# Patient Record
Sex: Male | Born: 1990 | Race: Black or African American | Hispanic: No | Smoking: Never smoker
Health system: Southern US, Community
[De-identification: ages and names within clinical notes are randomized; demographics above are authoritative.]

---
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
## 2010-05-22 ENCOUNTER — Emergency Department (HOSPITAL_COMMUNITY): Payer: No Typology Code available for payment source

## 2010-05-22 ENCOUNTER — Emergency Department (HOSPITAL_COMMUNITY)
Admission: EM | Admit: 2010-05-22 | Discharge: 2010-05-22 | Disposition: A | Discharge status: Home / Self Care | Payer: No Typology Code available for payment source | Attending: Emergency Medicine | Admitting: Emergency Medicine

## 2010-05-22 DIAGNOSIS — M542 Cervicalgia: Secondary | ICD-10-CM | POA: Insufficient documentation

## 2010-05-22 DIAGNOSIS — T1490XA Injury, unspecified, initial encounter: Secondary | ICD-10-CM | POA: Insufficient documentation

## 2010-05-22 DIAGNOSIS — M549 Dorsalgia, unspecified: Secondary | ICD-10-CM | POA: Insufficient documentation

## 2010-05-22 DIAGNOSIS — Y92410 Unspecified street and highway as the place of occurrence of the external cause: Secondary | ICD-10-CM | POA: Insufficient documentation

## 2017-02-07 ENCOUNTER — Emergency Department (HOSPITAL_COMMUNITY)
Admission: EM | Admit: 2017-02-07 | Discharge: 2017-02-08 | Disposition: A | Discharge status: Home / Self Care | Payer: BLUE CROSS/BLUE SHIELD | Attending: Emergency Medicine | Admitting: Emergency Medicine

## 2017-02-07 ENCOUNTER — Encounter (HOSPITAL_COMMUNITY): Payer: Self-pay | Admitting: Radiology

## 2017-02-07 ENCOUNTER — Emergency Department (HOSPITAL_COMMUNITY): Payer: BLUE CROSS/BLUE SHIELD

## 2017-02-07 DIAGNOSIS — R197 Diarrhea, unspecified: Secondary | ICD-10-CM | POA: Diagnosis not present

## 2017-02-07 DIAGNOSIS — R531 Weakness: Secondary | ICD-10-CM | POA: Diagnosis not present

## 2017-02-07 DIAGNOSIS — R42 Dizziness and giddiness: Secondary | ICD-10-CM | POA: Insufficient documentation

## 2017-02-07 DIAGNOSIS — R112 Nausea with vomiting, unspecified: Secondary | ICD-10-CM | POA: Diagnosis present

## 2017-02-07 DIAGNOSIS — R1033 Periumbilical pain: Secondary | ICD-10-CM | POA: Insufficient documentation

## 2017-02-07 LAB — URINALYSIS, ROUTINE W REFLEX MICROSCOPIC
Bilirubin Urine: NEGATIVE
GLUCOSE, UA: NEGATIVE mg/dL
HGB URINE DIPSTICK: NEGATIVE
Ketones, ur: 20 mg/dL — AB
Leukocytes, UA: NEGATIVE
Nitrite: NEGATIVE
Protein, ur: NEGATIVE mg/dL
SPECIFIC GRAVITY, URINE: 1.02 (ref 1.005–1.030)
pH: 9 — ABNORMAL HIGH (ref 5.0–8.0)

## 2017-02-07 LAB — CBC WITH DIFFERENTIAL/PLATELET
Basophils Absolute: 0 10*3/uL (ref 0.0–0.1)
Basophils Relative: 0 %
Eosinophils Absolute: 0 10*3/uL (ref 0.0–0.7)
Eosinophils Relative: 0 %
HCT: 41.3 % (ref 39.0–52.0)
HEMOGLOBIN: 14 g/dL (ref 13.0–17.0)
LYMPHS ABS: 0.6 10*3/uL — AB (ref 0.7–4.0)
Lymphocytes Relative: 7 %
MCH: 27.5 pg (ref 26.0–34.0)
MCHC: 33.9 g/dL (ref 30.0–36.0)
MCV: 81.1 fL (ref 78.0–100.0)
MONO ABS: 0.4 10*3/uL (ref 0.1–1.0)
MONOS PCT: 4 %
NEUTROS PCT: 89 %
Neutro Abs: 8.1 10*3/uL — ABNORMAL HIGH (ref 1.7–7.7)
Platelets: 217 10*3/uL (ref 150–400)
RBC: 5.09 MIL/uL (ref 4.22–5.81)
RDW: 12.3 % (ref 11.5–15.5)
WBC: 9.1 10*3/uL (ref 4.0–10.5)

## 2017-02-07 LAB — LIPASE, BLOOD: LIPASE: 29 U/L (ref 11–51)

## 2017-02-07 LAB — COMPREHENSIVE METABOLIC PANEL
ALBUMIN: 4.5 g/dL (ref 3.5–5.0)
ALT: 16 U/L — AB (ref 17–63)
AST: 22 U/L (ref 15–41)
Alkaline Phosphatase: 43 U/L (ref 38–126)
Anion gap: 10 (ref 5–15)
BUN: 19 mg/dL (ref 6–20)
CHLORIDE: 108 mmol/L (ref 101–111)
CO2: 22 mmol/L (ref 22–32)
Calcium: 9.3 mg/dL (ref 8.9–10.3)
Creatinine, Ser: 1.2 mg/dL (ref 0.61–1.24)
GFR calc non Af Amer: >60 mL/min (ref >60)
GLUCOSE: 96 mg/dL (ref 65–99)
Potassium: 3.5 mmol/L (ref 3.5–5.1)
SODIUM: 140 mmol/L (ref 135–145)
Total Bilirubin: 0.8 mg/dL (ref 0.3–1.2)
Total Protein: 7.7 g/dL (ref 6.5–8.1)

## 2017-02-07 MED ORDER — SODIUM CHLORIDE 0.9 % IV BOLUS (SEPSIS)
1000.0000 mL | Freq: Once | INTRAVENOUS | Status: AC
  Administered 2017-02-07: 1000 mL via INTRAVENOUS

## 2017-02-07 MED ORDER — DICYCLOMINE HCL 20 MG PO TABS: 20.0000 mg | ORAL_TABLET | Freq: Two times a day (BID) | ORAL | 0 refills | Status: DC

## 2017-02-07 MED ORDER — DICYCLOMINE HCL 10 MG PO CAPS
10.0000 mg | ORAL_CAPSULE | Freq: Once | ORAL | Status: AC
  Administered 2017-02-07: 10 mg via ORAL
  Filled 2017-02-07: qty 1

## 2017-02-07 MED ORDER — ONDANSETRON HCL 4 MG PO TABS: 4.0000 mg | ORAL_TABLET | Freq: Four times a day (QID) | ORAL | 0 refills | Status: DC

## 2017-02-07 MED ORDER — IOPAMIDOL (ISOVUE-300) INJECTION 61%
INTRAVENOUS | Status: AC
  Administered 2017-02-07: 100 mL via INTRAVENOUS
  Filled 2017-02-07: qty 100

## 2017-02-07 MED ORDER — PROMETHAZINE HCL 25 MG/ML IJ SOLN
25.0000 mg | Freq: Once | INTRAMUSCULAR | Status: AC
  Administered 2017-02-07: 25 mg via INTRAVENOUS
  Filled 2017-02-07: qty 1

## 2017-02-07 MED ORDER — ONDANSETRON 4 MG PO TBDP
4.0000 mg | ORAL_TABLET | Freq: Once | ORAL | Status: AC
  Administered 2017-02-07: 4 mg via ORAL
  Filled 2017-02-07: qty 1

## 2017-02-07 MED ORDER — LOPERAMIDE HCL 2 MG PO CAPS: 2.0000 mg | ORAL_CAPSULE | Freq: Four times a day (QID) | ORAL | 0 refills | Status: DC | PRN

## 2017-02-07 NOTE — ED Notes (Signed)
Bed: WA08 Expected date:  Expected time:  Means of arrival:  Comments: Possible EMS

## 2017-02-07 NOTE — ED Notes (Signed)
Pt states he cant hold the emesis bag and has proceeded to vomit all over the room.

## 2017-02-07 NOTE — ED Notes (Signed)
Have informed pt that urine sample is needed and given urinal. Pt continues to sleep

## 2017-02-07 NOTE — ED Provider Notes (Signed)
[facility]-EMERGENCY DEPT Provider Note   CSN: [id number] Arrival date & time: 02/07/17  1836     History   Chief Complaint Chief Complaint  Patient presents with  . N/V    HPI Brian Patel is a 27 y.o. male who presents for evaluation of nausea/vomiting that began approximately 3 PM this afternoon.  Patient reports multiple episodes of nonbloody, nonbilious vomiting since onset of symptoms.  Patient also reports multiple episodes of loose stool.  Denies any melena, bright red blood per rectum.  Patient reports that he feels nauseous and does not know if that is covering any lower abdominal pain.  He states that he had been in his normal state of health the day prior to onset of symptoms.  He states that he did not eat any new foods.  Patient called EMS for symptoms.  During EMS arrival, he got fluids, Zofran.  He reports no improvement with nausea with the Zofran.  Patient also reports intermittent tingling sensation to bilateral lower extremities.  He has been able to ambulate without any difficulty.  Patient denies any fevers, chest pain, difficulty breathing, dysuria, hematuria, back pain, testicular pain, penile pain, penile discharge. Denies fevers, weight loss, numbness/weakness of upper and lower extremities, bowel/bladder incontinence, saddle anesthesia, history of back surgery, history of IVDA.   The history is provided by the patient.    History reviewed. No pertinent past medical history.  There are no active problems to display for this patient.   The histories are not reviewed yet. Please review them in the "History" navigator section and refresh this SmartLink.     Home Medications    Prior to Admission medications   Medication Sig Start Date End Date Taking? Authorizing Provider  acetaminophen (TYLENOL) 325 MG tablet Take 650 mg by mouth every 6 (six) hours as needed.   Yes [provider]  Black Elderberry,Berry-Flower, 575 MG  CAPS Take 1 capsule by mouth.   Yes [provider]  ibuprofen (ADVIL,MOTRIN) 600 MG tablet Take 600 mg by mouth every 6 (six) hours as needed for headache.   Yes [provider]  Multiple Vitamins-Minerals (AIRBORNE GUMMIES PO) Take 1 capsule by mouth.   Yes [provider]  Sodium Bicarbonate-Citric Acid (ALKA-SELTZER HEARTBURN) 1940-1000 MG TBEF Take 1 Dose by mouth daily as needed (Heartburn).   Yes [provider]  dicyclomine (BENTYL) 20 MG tablet Take 1 tablet (20 mg total) by mouth 2 (two) times daily for 7 days. 02/07/17 02/14/17  Maxwell Caul, PA-C  loperamide (IMODIUM) 2 MG capsule Take 1 capsule (2 mg total) by mouth 4 (four) times daily as needed for diarrhea or loose stools. 02/07/17   Maxwell Caul, PA-C  ondansetron (ZOFRAN) 4 MG tablet Take 1 tablet (4 mg total) by mouth every 6 (six) hours. 02/07/17   Maxwell Caul, PA-C    Family History No family history on file.  Social History Social History   Tobacco Use  . Smoking status: Not on file  Substance Use Topics  . Alcohol use: Not on file  . Drug use: Not on file     Allergies   Patient has no allergy information on record.   Review of Systems Review of Systems  Constitutional: Negative for fever.  Respiratory: Negative for cough and shortness of breath.   Cardiovascular: Negative for chest pain.  Gastrointestinal: Positive for diarrhea, nausea and vomiting. Negative for abdominal pain.  Genitourinary: Negative for dysuria and hematuria.  Neurological:  Negative for headaches.     Physical Exam Updated Vital Signs BP (!) 108/53 (BP Location: Right Arm)   Pulse 100   Temp 98.7 F (37.1 C) (Oral)   Resp 16   SpO2 100%   Physical Exam  Constitutional: He is oriented to person, place, and time. He appears well-developed and well-nourished.  Appears uncomfortable but no acute distress   HENT:  Head: Normocephalic and atraumatic.  Mouth/Throat: Oropharynx is  clear and moist. Mucous membranes are dry.  Eyes: Conjunctivae, EOM and lids are normal. Pupils are equal, round, and reactive to light.  Neck: Full passive range of motion without pain.  Cardiovascular: Normal rate, regular rhythm, normal heart sounds and normal pulses. Exam reveals no gallop and no friction rub.  No murmur heard. Pulses:      Dorsalis pedis pulses are 2+ on the right side, and 2+ on the left side.  Pulmonary/Chest: Effort normal and breath sounds normal.  Abdominal: Soft. Normal appearance. There is tenderness in the suprapubic area. There is no rigidity, no guarding and no CVA tenderness. Hernia confirmed negative in the right inguinal area and confirmed negative in the left inguinal area.  Abdomen soft, nondistended.  Patient does exhibit some suprapubic tenderness.  No CVA tenderness bilaterally.  Genitourinary: Testes normal and penis normal. Right testis shows no swelling and no tenderness. Left testis shows no swelling and no tenderness. Uncircumcised.  Genitourinary Comments: The exam was performed with a chaperone present. Normal male genitalia. No evidence of rash, ulcers or lesions.   Musculoskeletal: Normal range of motion.  Neurological: He is alert and oriented to person, place, and time.  Follows commands, Moves all extremities  5/5 strength to BUE and BLE  Sensation intact throughout all major nerve distributions  Skin: Skin is warm and dry. Capillary refill takes less than 2 seconds.  Psychiatric: He has a normal mood and affect. His speech is normal.  Nursing note and vitals reviewed.    ED Treatments / Results  Labs (all labs ordered are listed, but only abnormal results are displayed) Labs Reviewed  COMPREHENSIVE METABOLIC PANEL - Abnormal; Notable for the following components:      Result Value   ALT 16 (*)    All other components within normal limits  CBC WITH DIFFERENTIAL/PLATELET - Abnormal; Notable for the following components:   Neutro Abs  8.1 (*)    Lymphs Abs 0.6 (*)    All other components within normal limits  URINALYSIS, ROUTINE W REFLEX MICROSCOPIC - Abnormal; Notable for the following components:   pH 9.0 (*)    Ketones, ur 20 (*)    All other components within normal limits  LIPASE, BLOOD    EKG  EKG Interpretation None       Radiology Ct Abdomen Pelvis W Contrast  Result Date: 02/07/2017 CLINICAL DATA:  Nausea and vomiting since this afternoon with generalized weakness and dizziness. Generalized body aches. EXAM: CT ABDOMEN AND PELVIS WITH CONTRAST TECHNIQUE: Multidetector CT imaging of the abdomen and pelvis was performed using the standard protocol following bolus administration of intravenous contrast. CONTRAST:  100mL ISOVUE-300 IOPAMIDOL (ISOVUE-300) INJECTION 61% COMPARISON:  None. FINDINGS: Lower chest: No acute abnormality. Hepatobiliary: Small hypodensity over the inferior aspect of the right lobe of the liver likely a hemangioma. Gallbladder and biliary tree are within normal. Pancreas: Normal. Spleen: Normal. Adrenals/Urinary Tract: Adrenal glands are normal. Kidneys are normal in size without hydronephrosis or nephrolithiasis. Ureters and bladder are normal. Stomach/Bowel: Stomach and small bowel are  within normal. Appendix is normal. Colon is somewhat decompressed but otherwise within normal. Vascular/Lymphatic: Within normal. Reproductive: Normal. Other: No free fluid or focal inflammatory change. Musculoskeletal: Normal. IMPRESSION: No acute findings in the abdomen/pelvis. Electronically Signed   By: Elberta Fortis M.D.   On: 02/07/2017 22:12    Procedures Procedures (including critical care time)  Medications Ordered in ED Medications  promethazine (PHENERGAN) injection 25 mg (25 mg Intravenous Given 02/07/17 2023)  sodium chloride 0.9 % bolus 1,000 mL (0 mLs Intravenous Stopped 02/07/17 2106)  iopamidol (ISOVUE-300) 61 % injection (100 mLs Intravenous Contrast Given 02/07/17 2148)  dicyclomine  (BENTYL) capsule 10 mg (10 mg Oral Given 02/07/17 2328)  ondansetron (ZOFRAN-ODT) disintegrating tablet 4 mg (4 mg Oral Given 02/07/17 2328)     Initial Impression / Assessment and Plan / ED Course  I have reviewed the triage vital signs and the nursing notes.  Pertinent labs & imaging results that were available during my care of the patient were reviewed by me and considered in my medical decision making (see chart for details).     27 y.o. male who presents for evaluation of nausea/vomiting that began 3 PM this afternoon.  He also is having loose stools.  No fevers.  No dysuria, hematuria, testicular pain or swelling. Patient is afebrile, non-toxic appearing. Vital signs reviewed and stable.  On exam, patient is actively vomiting.  Diffuse suprapubic tenderness.  No true McBurney's point tenderness but he does have some difficulty being examined secondary to vomiting.  GU exam is without any abnormalities.  Consider acute infectious etiology vs viral GI process.  History/physical exam are not concerning for kidney stone, diverticulitis, appendicitis.  Plan for additional antiemetics, IVF.  Will plan for basic labs.  Labs reviewed.  CBC is without any acute leukocytosis.  CMP shows no acute abnormalities.  UA is without any acute signs of infection.  Lipase is unremarkable. Given difficulty in assessing abdominal tenderness, will plan for CT abd/pelvis for further evaluation.   CT abd/pelvis reviewed. Negative for any acute abnormalities. Discussed results with patient.  He reports improvement in symptoms since being here in the ED. He has not had any additional episodes of vomiting since being in the ED. Repeat abdominal exam shows improvement in tenderness. Plan to p.o. challenge patient in the department.  Patient able to tolerate PO in the department without any difficulty. He is ambulating in the department without difficulty. Vitals stable. Suspect symptoms are secondary to viral GI process.  Patient reports his GF is being evaluted for same symptoms. Patient stable for discharge at this time. Plan to send home with anti-emetics. Patient had ample opportunity for questions and discussion. All patient's questions were answered with full understanding. Strict return precautions discussed. Patient expresses understanding and agreement to plan.    Final Clinical Impressions(s) / ED Diagnoses   Final diagnoses:  Non-intractable vomiting with nausea, unspecified vomiting type  Diarrhea, unspecified type    ED Discharge Orders        Ordered    ondansetron (ZOFRAN) 4 MG tablet  Every 6 hours     02/07/17 2333    dicyclomine (BENTYL) 20 MG tablet  2 times daily     02/07/17 2333    loperamide (IMODIUM) 2 MG capsule  4 times daily PRN     02/07/17 2333       Maxwell Caul, PA-C 02/08/17 1113    Lorre Nick, MD 02/09/17 [phone]

## 2017-02-07 NOTE — ED Triage Notes (Signed)
Per EMS-states nausea and vomiting since 3pm-states generalized weakness and feeling dizziness-4 mg of Zofran given in route-general body aches-positive response from Zofran-HR 102, BP 137/87, CBG157-500 cc bolus given in route

## 2017-02-07 NOTE — Discharge Instructions (Signed)
Take zofran as directed for nausea.   You can take the bentyl as needed for abdominal cramping.   You can use the Immodium as directed.   Follow-up with your primary care doctor the next 24-48 hours.  If you do not have primary care doctor, you can use the [facility] clinic listed below.  Return to the emergency department for any worsening pain, fever, persistent vomiting, pain of your testicles or penis, inability to ambulate or any other worsening or concerning symptoms.

## 2017-02-07 NOTE — ED Notes (Signed)
Pt ambulated in hallway without assistance. Pt was steady on his feet.

## 2018-08-17 IMAGING — CT CT ABD-PELV W/ CM
2 of 4 series · 16 of 46 positions shown, 18 images · IV contrast (iopamidol)
Comparison: None.

CLINICAL DATA: Nausea and vomiting since this afternoon with
generalized weakness and dizziness. Generalized body aches.

EXAM:
CT ABDOMEN AND PELVIS WITH CONTRAST
TECHNIQUE: Multidetector CT imaging of the abdomen and pelvis was performed
using the standard protocol following bolus administration of
intravenous contrast.
CONTRAST:  100mL [id number]-IUU IOPAMIDOL (6E5XOY-IUU) INJECTION 61%

[Series 2: axial st · axial · 0.71mm/px · z∈[-625,-220]mm · 13 of 91 slices shown, 15 images]
[im 5/91  soft-tissue]
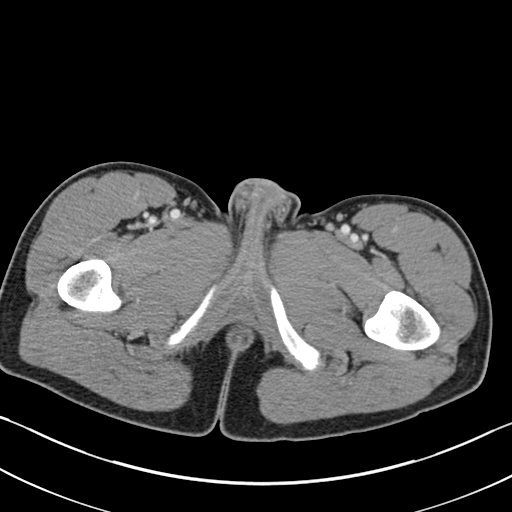
[im 5/91  bone]
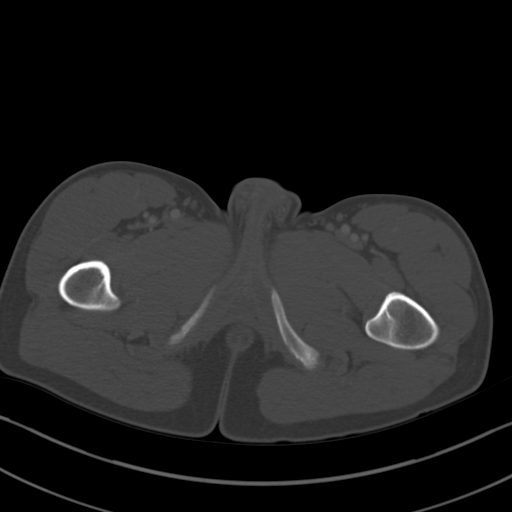
[im 13/91  soft-tissue]
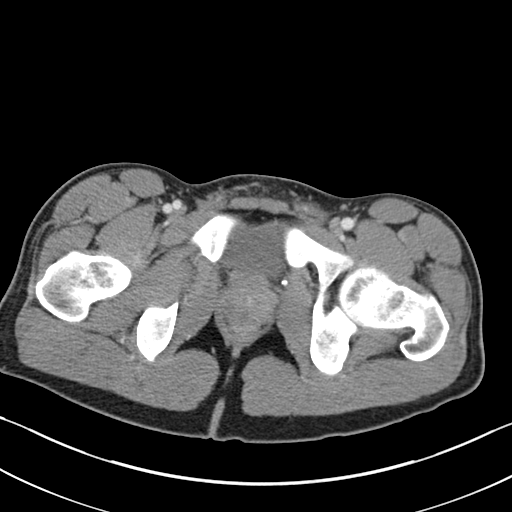
[im 21/91  soft-tissue]
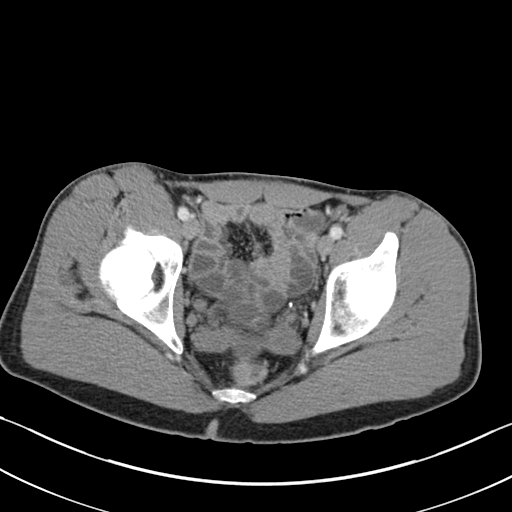
[im 25/91  soft-tissue]
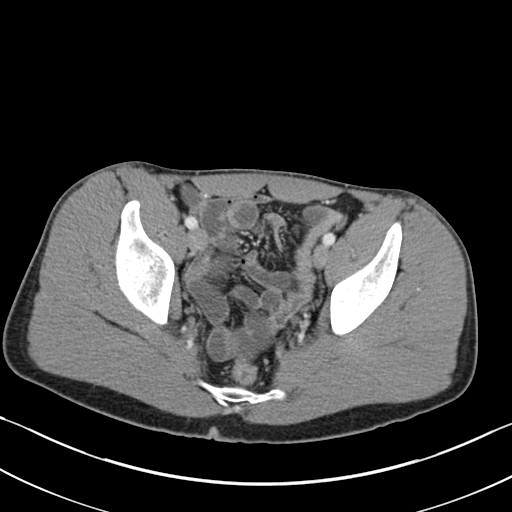
[im 33/91  soft-tissue]
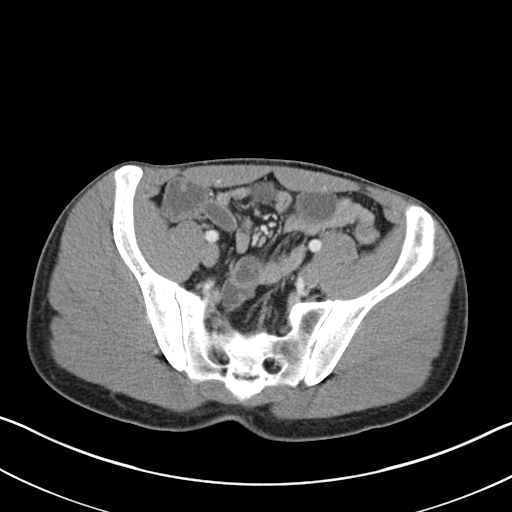
[im 37/91  soft-tissue]
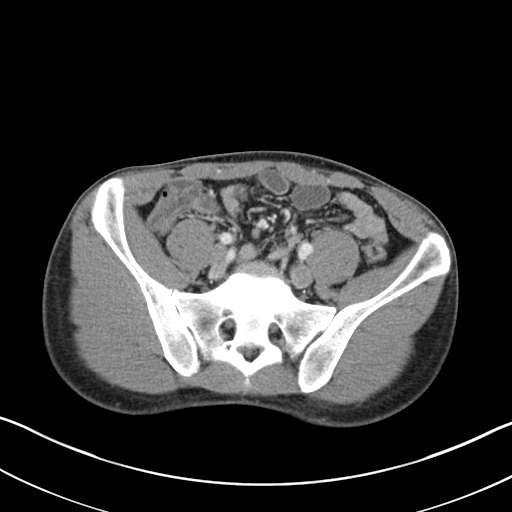
[im 46/91  soft-tissue]
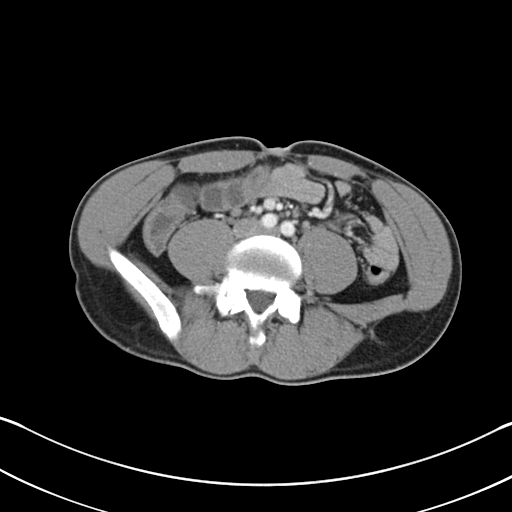
[im 54/91  soft-tissue]
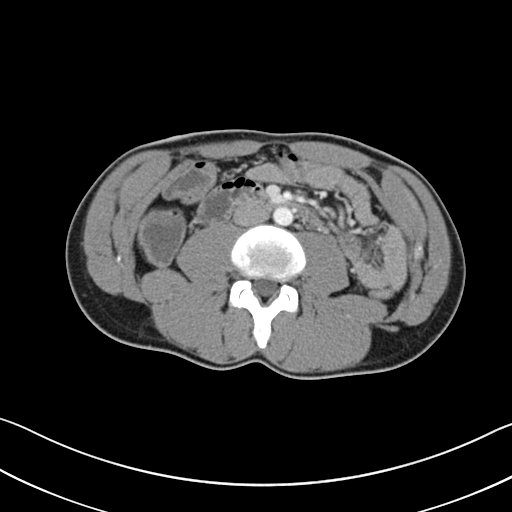
[im 58/91  soft-tissue]
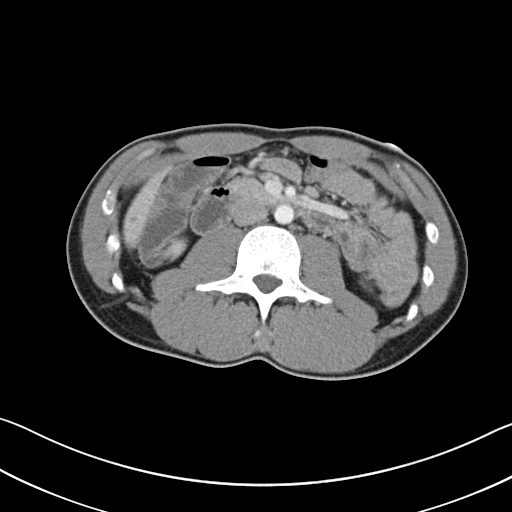
[im 58/91  bone]
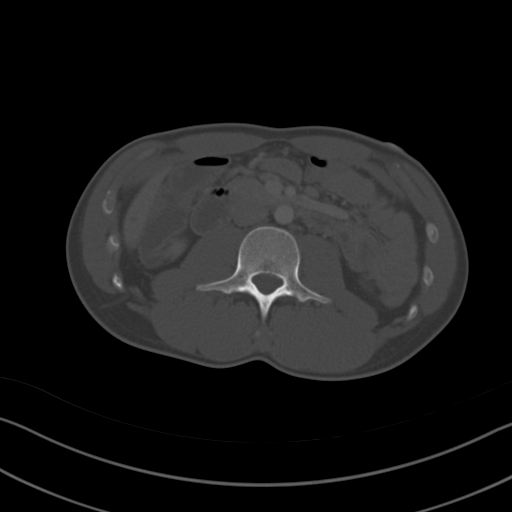
[im 66/91  soft-tissue]
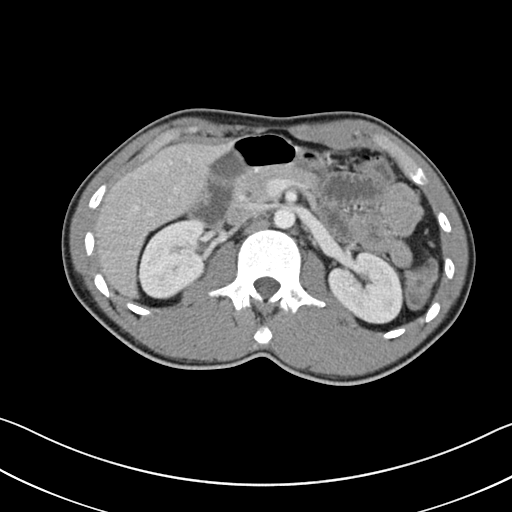
[im 70/91  soft-tissue]
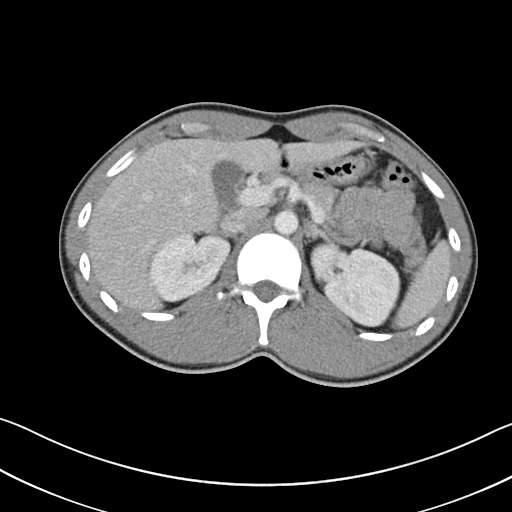
[im 78/91  soft-tissue]
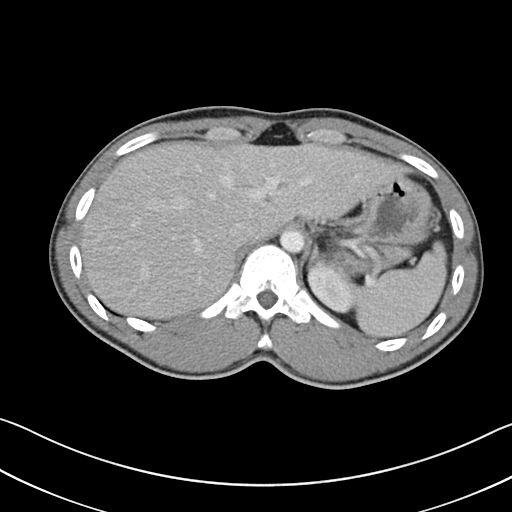
[im 86/91  soft-tissue]
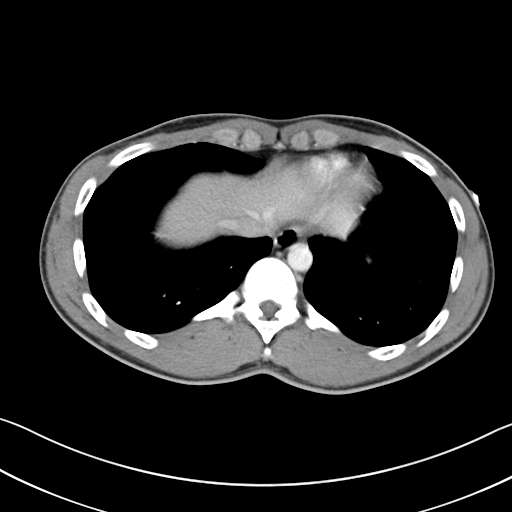

[Series 5: coronal st · coronal · 0.67mm/px · 3 of 61 slices shown]
[im 21/61  soft-tissue]
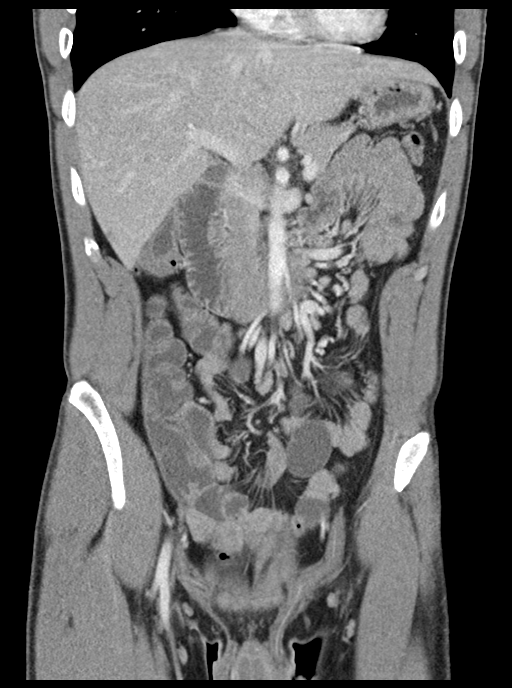
[im 27/61  soft-tissue]
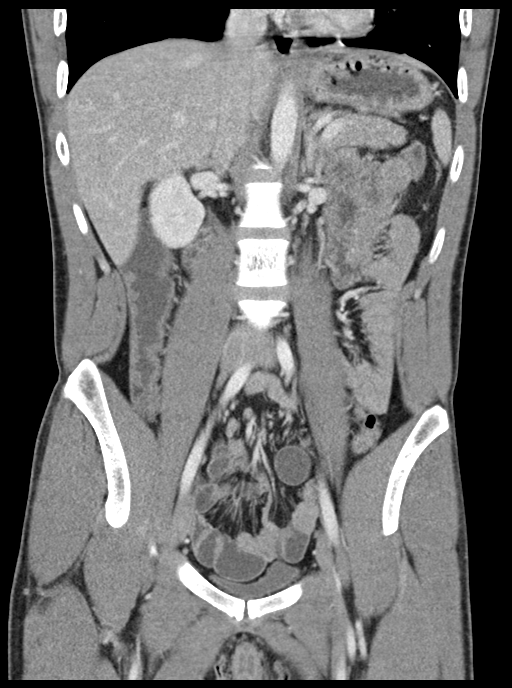
[im 34/61  soft-tissue]
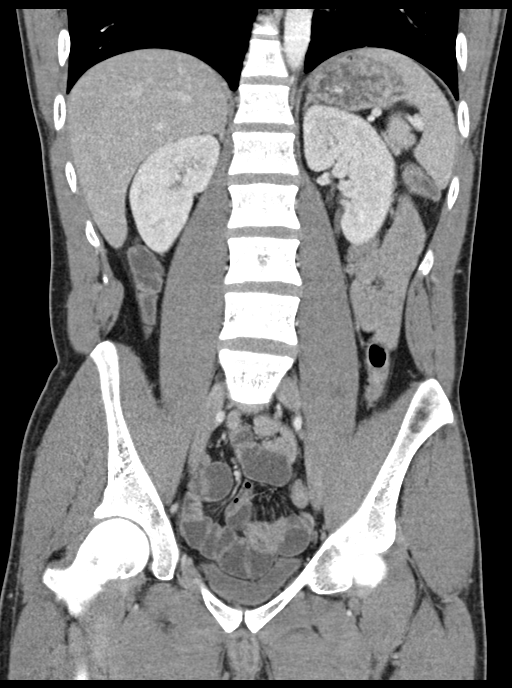

[16 of 46 positions shown; findings below may reference images not displayed]

FINDINGS: Lower chest: No acute abnormality.

Hepatobiliary: Small hypodensity over the inferior aspect of the
right lobe of the liver likely a hemangioma. Gallbladder and biliary
tree are within normal.

Pancreas: Normal.

Spleen: Normal.

Adrenals/Urinary Tract: Adrenal glands are normal. Kidneys are
normal in size without hydronephrosis or nephrolithiasis. Ureters
and bladder are normal.

Stomach/Bowel: Stomach and small bowel are within normal. Appendix
is normal. Colon is somewhat decompressed but otherwise within
normal.

Vascular/Lymphatic: Within normal.

Reproductive: Normal.

Other: No free fluid or focal inflammatory change.

Musculoskeletal: Normal.
IMPRESSION: No acute findings in the abdomen/pelvis.

## 2021-03-09 DIAGNOSIS — K219 Gastro-esophageal reflux disease without esophagitis: Secondary | ICD-10-CM | POA: Diagnosis not present

## 2021-03-09 DIAGNOSIS — R5383 Other fatigue: Secondary | ICD-10-CM | POA: Diagnosis not present

## 2021-03-09 DIAGNOSIS — J302 Other seasonal allergic rhinitis: Secondary | ICD-10-CM | POA: Diagnosis not present

## 2021-03-09 DIAGNOSIS — E559 Vitamin D deficiency, unspecified: Secondary | ICD-10-CM | POA: Diagnosis not present

## 2021-03-09 DIAGNOSIS — Z0000 Encounter for general adult medical examination without abnormal findings: Secondary | ICD-10-CM | POA: Diagnosis not present

## 2021-03-09 DIAGNOSIS — Z114 Encounter for screening for human immunodeficiency virus [HIV]: Secondary | ICD-10-CM | POA: Diagnosis not present

## 2021-03-09 DIAGNOSIS — Z1339 Encounter for screening examination for other mental health and behavioral disorders: Secondary | ICD-10-CM | POA: Diagnosis not present

## 2021-03-09 DIAGNOSIS — Z7251 High risk heterosexual behavior: Secondary | ICD-10-CM | POA: Diagnosis not present

## 2021-03-09 DIAGNOSIS — Z1331 Encounter for screening for depression: Secondary | ICD-10-CM | POA: Diagnosis not present

## 2021-03-23 DIAGNOSIS — R030 Elevated blood-pressure reading, without diagnosis of hypertension: Secondary | ICD-10-CM | POA: Diagnosis not present

## 2021-03-23 DIAGNOSIS — E7800 Pure hypercholesterolemia, unspecified: Secondary | ICD-10-CM | POA: Diagnosis not present

## 2021-03-23 DIAGNOSIS — J302 Other seasonal allergic rhinitis: Secondary | ICD-10-CM | POA: Diagnosis not present

## 2021-03-23 DIAGNOSIS — K219 Gastro-esophageal reflux disease without esophagitis: Secondary | ICD-10-CM | POA: Diagnosis not present

## 2021-11-17 DIAGNOSIS — F4324 Adjustment disorder with disturbance of conduct: Secondary | ICD-10-CM | POA: Diagnosis not present

## 2022-01-31 DIAGNOSIS — R300 Dysuria: Secondary | ICD-10-CM | POA: Diagnosis not present

## 2022-01-31 DIAGNOSIS — K219 Gastro-esophageal reflux disease without esophagitis: Secondary | ICD-10-CM | POA: Diagnosis not present

## 2022-01-31 DIAGNOSIS — E559 Vitamin D deficiency, unspecified: Secondary | ICD-10-CM | POA: Diagnosis not present

## 2022-01-31 DIAGNOSIS — Z6825 Body mass index (BMI) 25.0-25.9, adult: Secondary | ICD-10-CM | POA: Diagnosis not present

## 2022-01-31 DIAGNOSIS — Z1159 Encounter for screening for other viral diseases: Secondary | ICD-10-CM | POA: Diagnosis not present

## 2022-01-31 DIAGNOSIS — Z0000 Encounter for general adult medical examination without abnormal findings: Secondary | ICD-10-CM | POA: Diagnosis not present

## 2022-01-31 DIAGNOSIS — J302 Other seasonal allergic rhinitis: Secondary | ICD-10-CM | POA: Diagnosis not present

## 2022-01-31 DIAGNOSIS — R5383 Other fatigue: Secondary | ICD-10-CM | POA: Diagnosis not present

## 2022-03-07 DIAGNOSIS — B9689 Other specified bacterial agents as the cause of diseases classified elsewhere: Secondary | ICD-10-CM | POA: Diagnosis not present

## 2022-03-07 DIAGNOSIS — J329 Chronic sinusitis, unspecified: Secondary | ICD-10-CM | POA: Diagnosis not present

## 2022-03-07 DIAGNOSIS — Z20822 Contact with and (suspected) exposure to covid-19: Secondary | ICD-10-CM | POA: Diagnosis not present

## 2022-03-07 DIAGNOSIS — R059 Cough, unspecified: Secondary | ICD-10-CM | POA: Diagnosis not present

## 2022-06-21 DIAGNOSIS — F4324 Adjustment disorder with disturbance of conduct: Secondary | ICD-10-CM | POA: Diagnosis not present

## 2022-06-25 DIAGNOSIS — F4324 Adjustment disorder with disturbance of conduct: Secondary | ICD-10-CM | POA: Diagnosis not present

## 2022-07-02 DIAGNOSIS — F4324 Adjustment disorder with disturbance of conduct: Secondary | ICD-10-CM | POA: Diagnosis not present

## 2022-07-16 DIAGNOSIS — F4324 Adjustment disorder with disturbance of conduct: Secondary | ICD-10-CM | POA: Diagnosis not present

## 2022-07-19 DIAGNOSIS — F4324 Adjustment disorder with disturbance of conduct: Secondary | ICD-10-CM | POA: Diagnosis not present

## 2022-07-24 DIAGNOSIS — F4324 Adjustment disorder with disturbance of conduct: Secondary | ICD-10-CM | POA: Diagnosis not present

## 2022-07-26 ENCOUNTER — Other Ambulatory Visit: Payer: Self-pay

## 2022-07-26 ENCOUNTER — Encounter (HOSPITAL_BASED_OUTPATIENT_CLINIC_OR_DEPARTMENT_OTHER): Payer: Self-pay | Admitting: Emergency Medicine

## 2022-07-26 ENCOUNTER — Emergency Department (HOSPITAL_BASED_OUTPATIENT_CLINIC_OR_DEPARTMENT_OTHER): Payer: Self-pay

## 2022-07-26 ENCOUNTER — Emergency Department (HOSPITAL_BASED_OUTPATIENT_CLINIC_OR_DEPARTMENT_OTHER)
Admission: EM | Admit: 2022-07-26 | Discharge: 2022-07-26 | Disposition: A | Discharge status: Home / Self Care | Payer: Self-pay | Attending: Emergency Medicine | Admitting: Emergency Medicine

## 2022-07-26 DIAGNOSIS — F411 Generalized anxiety disorder: Secondary | ICD-10-CM | POA: Diagnosis not present

## 2022-07-26 DIAGNOSIS — R079 Chest pain, unspecified: Secondary | ICD-10-CM | POA: Diagnosis not present

## 2022-07-26 DIAGNOSIS — R002 Palpitations: Secondary | ICD-10-CM | POA: Diagnosis not present

## 2022-07-26 DIAGNOSIS — R9431 Abnormal electrocardiogram [ECG] [EKG]: Secondary | ICD-10-CM | POA: Insufficient documentation

## 2022-07-26 DIAGNOSIS — R0789 Other chest pain: Secondary | ICD-10-CM | POA: Diagnosis not present

## 2022-07-26 DIAGNOSIS — F331 Major depressive disorder, recurrent, moderate: Secondary | ICD-10-CM | POA: Diagnosis not present

## 2022-07-26 LAB — BASIC METABOLIC PANEL
Anion gap: 8 (ref 5–15)
BUN: 17 mg/dL (ref 6–20)
CO2: 28 mmol/L (ref 22–32)
Calcium: 9.7 mg/dL (ref 8.9–10.3)
Chloride: 102 mmol/L (ref 98–111)
Creatinine, Ser: 1.11 mg/dL (ref 0.61–1.24)
GFR, Estimated: >60 mL/min (ref >60)
Glucose, Bld: 94 mg/dL (ref 70–99)
Potassium: 3.7 mmol/L (ref 3.5–5.1)
Sodium: 138 mmol/L (ref 135–145)

## 2022-07-26 LAB — CBC
HCT: 40.6 % (ref 39.0–52.0)
Hemoglobin: 12.9 g/dL — ABNORMAL LOW (ref 13.0–17.0)
MCH: 26.7 pg (ref 26.0–34.0)
MCHC: 31.8 g/dL (ref 30.0–36.0)
MCV: 84.1 fL (ref 80.0–100.0)
Platelets: 234 10*3/uL (ref 150–400)
RBC: 4.83 MIL/uL (ref 4.22–5.81)
RDW: 12.7 % (ref 11.5–15.5)
WBC: 5.8 10*3/uL (ref 4.0–10.5)
nRBC: 0 % (ref 0.0–0.2)

## 2022-07-26 LAB — TROPONIN I (HIGH SENSITIVITY): Troponin I (High Sensitivity): 2 ng/L (ref <18)

## 2022-07-26 LAB — MAGNESIUM: Magnesium: 2.1 mg/dL (ref 1.7–2.4)

## 2022-07-26 NOTE — ED Notes (Signed)
 RN reviewed discharge instructions with pt. Pt verbalized understanding and had no further questions. VSS upon discharge.  

## 2022-07-26 NOTE — ED Triage Notes (Signed)
Pt was being seen by new primary, he hasn't had any time to have himself checked out. He states the MD said he had an irregular EKG. Pt has no medical history. Does endorse some anxiety . He states that he has been having intermittent midsternal chest pain ,sharp and sometimes his left arm hurts as well, this has been going on for years and didn't have time to take care of  himself.

## 2022-07-26 NOTE — ED Provider Notes (Signed)
[place] EMERGENCY DEPARTMENT AT [facility] Provider Note   CSN: [id number] Arrival date & time: 07/26/22  1737     History Chief Complaint  Patient presents with   Abnormal ECG    HPI Brian Patel is a 32 y.o. male presenting for chief complaint of palpitations.  32 year old male minimal medical history.  History of anxiety seen by new PCP today found to have sinus arrhythmia on his EKG sent from PCP to emergency department for further diagnostic care and management.  Denies fevers chills nausea vomiting syncope shortness of breath or chest pain.  Otherwise ambulatory tolerating p.o. intake.  Endorse a longstanding history of anxiety that he follows with his PCP regarding..   Patient's recorded medical, surgical, social, medication list and allergies were reviewed in the Snapshot window as part of the initial history.   Review of Systems   Review of Systems  Constitutional:  Negative for chills and fever.  HENT:  Negative for ear pain and sore throat.   Eyes:  Negative for pain and visual disturbance.  Respiratory:  Negative for cough and shortness of breath.   Cardiovascular:  Positive for palpitations. Negative for chest pain.  Gastrointestinal:  Negative for abdominal pain and vomiting.  Genitourinary:  Negative for dysuria and hematuria.  Musculoskeletal:  Negative for arthralgias and back pain.  Skin:  Negative for color change and rash.  Neurological:  Negative for seizures and syncope.  All other systems reviewed and are negative.   Physical Exam Updated Vital Signs BP (!) 163/85 (BP Location: Right Arm)   Pulse 64   Temp 97.8 F (36.6 C)   Resp 20   Ht 5\' 11"  (1.803 m)   Wt 80.7 kg   SpO2 100%   BMI 24.83 kg/m  Physical Exam Vitals and nursing note reviewed.  Constitutional:      General: He is not in acute distress.    Appearance: He is well-developed.  HENT:     Head: Normocephalic and atraumatic.  Eyes:     Conjunctiva/sclera:  Conjunctivae normal.  Cardiovascular:     Rate and Rhythm: Normal rate. Rhythm irregular.     Heart sounds: No murmur heard. Pulmonary:     Effort: Pulmonary effort is normal. No respiratory distress.     Breath sounds: Normal breath sounds.  Abdominal:     Palpations: Abdomen is soft.     Tenderness: There is no abdominal tenderness.  Musculoskeletal:        General: No swelling.     Cervical back: Neck supple.  Skin:    General: Skin is warm and dry.     Capillary Refill: Capillary refill takes less than 2 seconds.  Neurological:     Mental Status: He is alert.  Psychiatric:        Mood and Affect: Mood normal.      ED Course/ Medical Decision Making/ A&P Clinical Course as of 07/26/22 1903  Thu Jul 26, 2022  1756 EKG 12-Lead [NF]  1758 DG Chest 2 View [NF]    Clinical Course User Index [NF] Flowers, Dorene Grebe, [place]    Procedures Procedures   Medications Ordered in ED Medications - No data to display Medical Decision Making: Brian Patel is a 32 y.o. male who presented to the ED today with chest pain, detailed above.  Based on patient's comorbidities, patient has a heart score of 0.    Patient's presentation is complicated by their history of palpitations.  Patient placed on continuous vitals and telemetry  monitoring while in ED which was reviewed periodically.  Reviewed and confirmed nursing documentation for past medical history, family history, social history.    Initial Assessment: With the patient's presentation of left-sided chest pain, most likely diagnosis is musculoskeletal chest pain versus GERD, although ACS remains on the differential. Other diagnoses were considered including (but not limited to) pulmonary embolism, community-acquired pneumonia, aortic dissection, pneumothorax, underlying bony abnormality, anemia. These are considered less likely due to history of present illness and physical exam findings.    In particular, concerning pulmonary  embolism: Patient is PERC negative and the they deny malignancy, recent surgery, history of DVT, or calf tenderness leading to a low risk Wells score. Aortic Dissection also reconsidered but seems less likely based on the location, quality, onset, and severity of symptoms in this case. Patient has a lack of serious comorbidities for this condition including a lack of HTN or Smoking. Patient also has a lack of underlying history of AD or TAA.  This is most consistent with an acute life/limb threatening illness complicated by underlying chronic conditions.   Initial Plan: Evaluate for ACS with single troponin and EKG evaluated as below  Evaluate for dissection, bony abnormality, or pneumonia with chest x-ray and screening laboratory evaluation including CBC, BMP  Further evaluation for pulmonary embolism not indicated at this time based on patient's PERC and Wells score.  Further evaluation for Thoracic Aortic Dissection not indicated at this time based on patient's clinical history and PE findings.   Initial Study Results: EKG was reviewed independently. Rate, rhythm, axis, intervals all examined and without medically relevant abnormality. ST segments without concerns for elevations.    Laboratory  Single troponin demonstrated NAA   CBC and BMP without obvious metabolic or inflammatory abnormalities requiring further evaluation   Radiology  DG Chest Port 1 View  Result Date: 07/26/2022 CLINICAL DATA:  Chest pain EXAM: PORTABLE CHEST 1 VIEW COMPARISON:  None Available. FINDINGS: The heart size and mediastinal contours are within normal limits. Both lungs are clear. The visualized skeletal structures are unremarkable. IMPRESSION: No active disease. Electronically Signed   By: Minerva Fester M.D.   On: 07/26/2022 18:21    Final Assessment and Plan: Reassessed at bedside. After an hour and a half in the emergency department, patient has mild hypertension but otherwise no acute abnormalities.  He  does still have sinus arrhythmia on the monitor but is asymptomatic. Given negative troponin, reassuring lab work patient is stable for outpatient care and management.  Will refer to cardiology for further workup of palpitations including consideration for Zio patch placement.  Will need to follow-up with PCP for further diagnostic care and management of his hypertension.   Clinical Impression:  1. Palpitations      Discharge   Final Clinical Impression(s) / ED Diagnoses Final diagnoses:  Palpitations    Rx / DC Orders ED Discharge Orders          Ordered    Ambulatory referral to Cardiology        07/26/22 1857              Glyn Ade, MD 07/26/22 [phone]

## 2022-08-02 DIAGNOSIS — F4324 Adjustment disorder with disturbance of conduct: Secondary | ICD-10-CM | POA: Diagnosis not present

## 2022-08-06 DIAGNOSIS — F4324 Adjustment disorder with disturbance of conduct: Secondary | ICD-10-CM | POA: Diagnosis not present

## 2022-08-08 ENCOUNTER — Ambulatory Visit: Payer: Self-pay | Admitting: Internal Medicine

## 2022-08-15 DIAGNOSIS — F4324 Adjustment disorder with disturbance of conduct: Secondary | ICD-10-CM | POA: Diagnosis not present

## 2022-08-20 DIAGNOSIS — F4324 Adjustment disorder with disturbance of conduct: Secondary | ICD-10-CM | POA: Diagnosis not present

## 2022-08-27 NOTE — Progress Notes (Unsigned)
  Cardiology Office Note:  .    Date:  08/28/2022  ID:  Brian Patel, DOB May 03, 1990, MRN [id number] PCP: Shireen Quan, DO  [facility] Providers Cardiologist:  None     CC: Palpitations Consulted for the evaluation of palpitations at the behest of Dr. Doran Durand  History of Present Illness: .    Brian Patel is a 32 y.o. male with palpitations.  He has been feeling "it" in his chest for some time. Three different feelings.  One is a floating pain his his chest.  It comes and goes.  Feels his heart beat hard.   He has chest pain in the center of his chest, thought it was heart burn. Has had chest pain that radiates to his arm.  He felt horrible coming in.  Since he left the ED 07/26/22: he cut out drinking soda.  Cut out caffiene sparkling water.  No shortness of breath, DOE .  No PND or orthopnea.  No weight gain, leg swelling , or abdominal swelling.  No syncope or near syncope . Notes  no palpitations or funny heart beats.     Patient reports prior cardiac testing including recent EKG with sinus arrhythmia  Relevant histories: .  Social Formerly worked nights. No family history of heart problems. ROS: As per HPI.   Physical Exam:    VS:  BP 110/78   Pulse 78   Ht 5\' 11"  (1.803 m)   Wt 181 lb 3.2 oz (82.2 kg)   SpO2 98%   BMI 25.27 kg/m    Wt Readings from Last 3 Encounters:  08/28/22 181 lb 3.2 oz (82.2 kg)  07/26/22 178 lb (80.7 kg)    Gen: no distress  Neck: No JVD Cardiac: No Rubs or Gallops, no Murmur, RRR  +2 radial pulses Respiratory: Clear to auscultation bilaterally, normal effort, normal  respiratory rate GI: Soft, nontender, non-distended  MS: No  edema;  moves all extremities Integument: Skin feels warm Neuro:  At time of evaluation, alert and oriented to person/place/time/situation  Psych: anxious affect, patient feels ok   ASSESSMENT AND PLAN: .    Palpitations Chest pain Sinus arrhythmia  - SA is benign, gave education -  will get POET and Ziopatch, suspect his sx are anxiety related  Anxiety - He has no cardiac limitations to work; he has asked that we fill out his FMLA paperwork; he needs an MD evaluation; he is aware that I have no cardiac limitations that would keep him from working; his therapist will fill out this form  PRN f/u  Riley Lam, MD FASE [facility] Cardiologist [facility]  [address], [address] [place], [place] [address] [phone]  9:28 AM

## 2022-08-28 ENCOUNTER — Ambulatory Visit: Payer: BC Managed Care – PPO

## 2022-08-28 ENCOUNTER — Ambulatory Visit: Payer: BC Managed Care – PPO | Attending: Internal Medicine | Admitting: Internal Medicine

## 2022-08-28 ENCOUNTER — Telehealth: Payer: Self-pay | Admitting: Internal Medicine

## 2022-08-28 ENCOUNTER — Encounter: Payer: Self-pay | Admitting: Internal Medicine

## 2022-08-28 ENCOUNTER — Other Ambulatory Visit: Payer: Self-pay | Admitting: Internal Medicine

## 2022-08-28 VITALS — BP 110/78 | HR 78 | Ht 71.0 in | Wt 181.2 lb

## 2022-08-28 DIAGNOSIS — Z0279 Encounter for issue of other medical certificate: Secondary | ICD-10-CM

## 2022-08-28 DIAGNOSIS — F4324 Adjustment disorder with disturbance of conduct: Secondary | ICD-10-CM | POA: Diagnosis not present

## 2022-08-28 DIAGNOSIS — R072 Precordial pain: Secondary | ICD-10-CM

## 2022-08-28 DIAGNOSIS — F419 Anxiety disorder, unspecified: Secondary | ICD-10-CM

## 2022-08-28 DIAGNOSIS — R002 Palpitations: Secondary | ICD-10-CM

## 2022-08-28 NOTE — Telephone Encounter (Signed)
MD has completed paperwork.  I have left at front desk for f/u.

## 2022-08-28 NOTE — Patient Instructions (Addendum)
Medication Instructions:  Your physician recommends that you continue on your current medications as directed. Please refer to the Current Medication list given to you today.  *If you need a refill on your cardiac medications before your next appointment, please call your pharmacy*   Lab Work: NONE If you have labs (blood work) drawn today and your tests are completely normal, you will receive your results only by: MyChart Message (if you have MyChart) OR A paper copy in the mail If you have any lab test that is abnormal or we need to change your treatment, we will call you to review the results.   Testing/Procedures: Your physician has requested that you have an exercise tolerance test. For further information please visit [email or web address] Please also follow instruction sheet, as given.  Your physician has requested that you wear a heart monitor.   Follow-Up:As needed At [facility], you and your health needs are our priority.  As part of our continuing mission to provide you with exceptional heart care, we have created designated Provider Care Teams.  These Care Teams include your primary Cardiologist (physician) and Advanced Practice Providers (APPs -  Physician Assistants and Nurse Practitioners) who all work together to provide you with the care you need, when you need it.   Provider:   Riley Lam, MD  Other Instructions Christena Deem- Long Term Monitor Instructions  Your physician has requested you wear a ZIO patch monitor for 14 days.  This is a single patch monitor. [employer] supplies one patch monitor per enrollment. Additional stickers are not available. Please do not apply patch if you will be having a Nuclear Stress Test,  Cardiac CT, MRI, or Chest Xray during the period you would be wearing the  monitor. The patch cannot be worn during these tests. You cannot remove and re-apply the  ZIO XT patch monitor.  Your ZIO patch monitor will be mailed 3 day  USPS to your address on file. It may take 3-5 days  to receive your monitor after you have been enrolled.  Once you have received your monitor, please review the enclosed instructions. Your monitor  has already been registered assigning a specific monitor serial # to you.  Billing and Patient Assistance Program Information  We have supplied [employer] with any of your insurance information on file for billing purposes. [employer] offers a sliding scale Patient Assistance Program for patients that do not have  insurance, or whose insurance does not completely cover the cost of the ZIO monitor.  You must apply for the Patient Assistance Program to qualify for this discounted rate.  To apply, please call [employer] at [phone], select option 4, select option 2, ask to apply for  Patient Assistance Program. Meredeth Ide will ask your household income, and how many people  are in your household. They will quote your out-of-pocket cost based on that information.  [employer] will also be able to set up a 109-month, interest-free payment plan if needed.  Applying the monitor   Shave hair from upper left chest.  Hold abrader disc by orange tab. Rub abrader in 40 strokes over the upper left chest as  indicated in your monitor instructions.  Clean area with 4 enclosed alcohol pads. Let dry.  Apply patch as indicated in monitor instructions. Patch will be placed under collarbone on left  side of chest with arrow pointing upward.  Rub patch adhesive wings for 2 minutes. Remove white label marked "1". Remove the white  label marked "2". Rub patch  adhesive wings for 2 additional minutes.  While looking in a mirror, press and release button in center of patch. A small green light will  flash 3-4 times. This will be your only indicator that the monitor has been turned on.  Do not shower for the first 24 hours. You may shower after the first 24 hours.  Press the button if you feel a symptom. You will hear a small  click. Record Date, Time and  Symptom in the Patient Logbook.  When you are ready to remove the patch, follow instructions on the last 2 pages of Patient  Logbook. Stick patch monitor onto the last page of Patient Logbook.  Place Patient Logbook in the blue and white box. Use locking tab on box and tape box closed  securely. The blue and white box has prepaid postage on it. Please place it in the mailbox as  soon as possible. Your physician should have your test results approximately 7 days after the  monitor has been mailed back to [facility].  Call [facility] Customer Care at [phone] if you have questions regarding  your ZIO XT patch monitor. Call them immediately if you see an orange light blinking on your  monitor.  If your monitor falls off in less than 4 days, contact our Monitor department at [phone].  If your monitor becomes loose or falls off after 4 days call [employer] at [phone] for  suggestions on securing your monitor

## 2022-08-28 NOTE — Telephone Encounter (Signed)
Pt dropped off fmla pw. Pt has paid, dropping off at pod for provider, would like it completed asap.  Please call pt when complete to pick up copies of completed form.

## 2022-08-28 NOTE — Telephone Encounter (Signed)
Pw faxed to [employer]. Pw scanned into chart. Pt called to let them know pw has been completed.

## 2022-08-28 NOTE — Progress Notes (Unsigned)
Enrolled patient for a 14 day Zio XT  monitor to be mailed to patients home  °

## 2022-08-30 DIAGNOSIS — F4324 Adjustment disorder with disturbance of conduct: Secondary | ICD-10-CM | POA: Diagnosis not present

## 2022-09-03 DIAGNOSIS — F4324 Adjustment disorder with disturbance of conduct: Secondary | ICD-10-CM | POA: Diagnosis not present

## 2022-09-12 DIAGNOSIS — F4324 Adjustment disorder with disturbance of conduct: Secondary | ICD-10-CM | POA: Diagnosis not present

## 2022-09-20 ENCOUNTER — Ambulatory Visit: Payer: BC Managed Care – PPO | Attending: Internal Medicine

## 2022-09-20 DIAGNOSIS — F4324 Adjustment disorder with disturbance of conduct: Secondary | ICD-10-CM | POA: Diagnosis not present

## 2022-09-21 DIAGNOSIS — F4324 Adjustment disorder with disturbance of conduct: Secondary | ICD-10-CM | POA: Diagnosis not present

## 2022-10-05 DIAGNOSIS — F4324 Adjustment disorder with disturbance of conduct: Secondary | ICD-10-CM | POA: Diagnosis not present

## 2022-10-10 DIAGNOSIS — F4324 Adjustment disorder with disturbance of conduct: Secondary | ICD-10-CM | POA: Diagnosis not present

## 2022-10-24 DIAGNOSIS — F4324 Adjustment disorder with disturbance of conduct: Secondary | ICD-10-CM | POA: Diagnosis not present

## 2022-10-26 DIAGNOSIS — F4324 Adjustment disorder with disturbance of conduct: Secondary | ICD-10-CM | POA: Diagnosis not present

## 2022-10-30 DIAGNOSIS — F4324 Adjustment disorder with disturbance of conduct: Secondary | ICD-10-CM | POA: Diagnosis not present

## 2022-10-31 DIAGNOSIS — F4324 Adjustment disorder with disturbance of conduct: Secondary | ICD-10-CM | POA: Diagnosis not present

## 2022-11-02 ENCOUNTER — Encounter: Payer: Self-pay | Admitting: Internal Medicine

## 2022-11-02 DIAGNOSIS — F4324 Adjustment disorder with disturbance of conduct: Secondary | ICD-10-CM | POA: Diagnosis not present

## 2022-11-08 DIAGNOSIS — F4324 Adjustment disorder with disturbance of conduct: Secondary | ICD-10-CM | POA: Diagnosis not present

## 2022-11-09 DIAGNOSIS — F4324 Adjustment disorder with disturbance of conduct: Secondary | ICD-10-CM | POA: Diagnosis not present

## 2022-11-21 DIAGNOSIS — F4324 Adjustment disorder with disturbance of conduct: Secondary | ICD-10-CM | POA: Diagnosis not present

## 2022-11-26 DIAGNOSIS — F4324 Adjustment disorder with disturbance of conduct: Secondary | ICD-10-CM | POA: Diagnosis not present

## 2022-11-29 DIAGNOSIS — F4324 Adjustment disorder with disturbance of conduct: Secondary | ICD-10-CM | POA: Diagnosis not present

## 2022-11-30 DIAGNOSIS — F4324 Adjustment disorder with disturbance of conduct: Secondary | ICD-10-CM | POA: Diagnosis not present

## 2022-12-04 DIAGNOSIS — F4324 Adjustment disorder with disturbance of conduct: Secondary | ICD-10-CM | POA: Diagnosis not present

## 2022-12-05 DIAGNOSIS — F4324 Adjustment disorder with disturbance of conduct: Secondary | ICD-10-CM | POA: Diagnosis not present

## 2022-12-13 DIAGNOSIS — F4324 Adjustment disorder with disturbance of conduct: Secondary | ICD-10-CM | POA: Diagnosis not present

## 2022-12-14 DIAGNOSIS — F4324 Adjustment disorder with disturbance of conduct: Secondary | ICD-10-CM | POA: Diagnosis not present

## 2022-12-19 DIAGNOSIS — F4324 Adjustment disorder with disturbance of conduct: Secondary | ICD-10-CM | POA: Diagnosis not present

## 2022-12-21 DIAGNOSIS — F4324 Adjustment disorder with disturbance of conduct: Secondary | ICD-10-CM | POA: Diagnosis not present

## 2022-12-28 DIAGNOSIS — F4324 Adjustment disorder with disturbance of conduct: Secondary | ICD-10-CM | POA: Diagnosis not present

## 2023-05-03 NOTE — Telephone Encounter (Signed)
 Paperwork has been discarded since patient hasn't picked it from our office.
